# Patient Record
Sex: Male | Born: 1960 | Race: White | Hispanic: No | Marital: Married | Smoking: Never smoker
Health system: Southern US, Community
[De-identification: ages and names within clinical notes are randomized; demographics above are authoritative.]

## PROBLEM LIST (undated history)

## (undated) DIAGNOSIS — G610 Guillain-Barre syndrome: Secondary | ICD-10-CM

## (undated) DIAGNOSIS — I10 Essential (primary) hypertension: Secondary | ICD-10-CM

## (undated) HISTORY — PX: FRACTURE SURGERY: SHX138

---

## 2014-08-27 ENCOUNTER — Emergency Department (HOSPITAL_COMMUNITY): Payer: Non-veteran care

## 2014-08-27 ENCOUNTER — Emergency Department (HOSPITAL_COMMUNITY)
Admission: EM | Admit: 2014-08-27 | Discharge: 2014-08-27 | Disposition: A | Discharge status: Home / Self Care | Payer: Non-veteran care | Attending: Emergency Medicine | Admitting: Emergency Medicine

## 2014-08-27 ENCOUNTER — Encounter (HOSPITAL_COMMUNITY): Payer: Self-pay | Admitting: Emergency Medicine

## 2014-08-27 DIAGNOSIS — Y998 Other external cause status: Secondary | ICD-10-CM | POA: Diagnosis not present

## 2014-08-27 DIAGNOSIS — W11XXXA Fall on and from ladder, initial encounter: Secondary | ICD-10-CM | POA: Diagnosis not present

## 2014-08-27 DIAGNOSIS — Z23 Encounter for immunization: Secondary | ICD-10-CM | POA: Diagnosis not present

## 2014-08-27 DIAGNOSIS — S41111A Laceration without foreign body of right upper arm, initial encounter: Secondary | ICD-10-CM | POA: Insufficient documentation

## 2014-08-27 DIAGNOSIS — Y9289 Other specified places as the place of occurrence of the external cause: Secondary | ICD-10-CM | POA: Insufficient documentation

## 2014-08-27 DIAGNOSIS — Y9389 Activity, other specified: Secondary | ICD-10-CM | POA: Diagnosis not present

## 2014-08-27 DIAGNOSIS — W19XXXA Unspecified fall, initial encounter: Secondary | ICD-10-CM

## 2014-08-27 MED ORDER — OXYCODONE-ACETAMINOPHEN 5-325 MG PO TABS
1.0000 | ORAL_TABLET | Freq: Once | ORAL | Status: AC
  Administered 2014-08-27: 1 via ORAL
  Filled 2014-08-27: qty 1

## 2014-08-27 MED ORDER — OXYCODONE HCL 5 MG PO TABS: 5.0000 mg | ORAL_TABLET | ORAL | Status: AC | PRN

## 2014-08-27 MED ORDER — LIDOCAINE HCL 2 % IJ SOLN
20.0000 mL | Freq: Once | INTRAMUSCULAR | Status: DC
  Filled 2014-08-27: qty 20

## 2014-08-27 MED ORDER — TETANUS-DIPHTH-ACELL PERTUSSIS 5-2.5-18.5 LF-MCG/0.5 IM SUSP
0.5000 mL | Freq: Once | INTRAMUSCULAR | Status: DC
  Filled 2014-08-27: qty 0.5

## 2014-08-27 MED ORDER — LIDOCAINE-EPINEPHRINE (PF) 2 %-1:200000 IJ SOLN
10.0000 mL | Freq: Once | INTRAMUSCULAR | Status: AC
  Administered 2014-08-27: 10 mL
  Filled 2014-08-27: qty 20

## 2014-08-27 NOTE — ED Notes (Signed)
Pt to ED sts he fell through a floor while working on a old house.  Pt cut his right forearm on staples.  Dressing not removed in triage due to possible artery being cut

## 2014-08-27 NOTE — ED Notes (Signed)
MD at bedside. 

## 2014-08-27 NOTE — ED Provider Notes (Signed)
CSN: [id number]     Arrival date & time 08/27/14  1504 History   First MD Initiated Contact with Patient 08/27/14 1554     Chief Complaint  Patient presents with  . Extremity Laceration     (Consider location/radiation/quality/duration/timing/severity/associated sxs/prior Treatment) Patient is a 54 y.o. male presenting with fall. The history is provided by the patient. No language interpreter was used.  Fall This is a new problem. The current episode started today. The problem has been unchanged. Pertinent negatives include no abdominal pain, arthralgias, chest pain, congestion, coughing, diaphoresis, fatigue, numbness, vertigo or weakness. Nothing aggravates the symptoms. He has tried nothing for the symptoms.    History reviewed. No pertinent past medical history. Past Surgical History  Procedure Laterality Date  . Fracture surgery     No family history on file. History  Substance Use Topics  . Smoking status: Never Smoker   . Smokeless tobacco: Not on file  . Alcohol Use: No    Review of Systems  Constitutional: Negative for diaphoresis and fatigue.  HENT: Negative for congestion.   Respiratory: Negative for cough.   Cardiovascular: Negative for chest pain.  Gastrointestinal: Negative for abdominal pain.  Musculoskeletal: Negative for arthralgias.  Skin: Positive for wound.  Neurological: Negative for vertigo, weakness and numbness.      Allergies  Review of patient's allergies indicates no known allergies.  Home Medications   Prior to Admission medications   Not on File   BP 121/89 mmHg  Pulse 72  Temp(Src) 97.3 F (36.3 C) (Oral)  Resp 18  SpO2 99% Physical Exam  Constitutional: He is oriented to person, place, and time. He appears well-developed and well-nourished. No distress.  HENT:  Head: Normocephalic and atraumatic.  Nose: Nose normal.  Mouth/Throat: Oropharynx is clear and moist. No oropharyngeal exudate.  Eyes: EOM are normal. Pupils are equal,  round, and reactive to light.  Neck: Normal range of motion. Neck supple.  Cardiovascular: Normal rate, regular rhythm, normal heart sounds and intact distal pulses.   No murmur heard. Pulmonary/Chest: Effort normal and breath sounds normal. No respiratory distress. He has no wheezes. He exhibits no tenderness.  Abdominal: Soft. He exhibits no distension. There is no tenderness. There is no guarding.  Musculoskeletal: Normal range of motion. He exhibits tenderness (right elbow).       Arms: Tender to palp of proximal forearm.  Distally NVI with good sensation and strength, 2+ radial and ulnar pulses   Neurological: He is alert and oriented to person, place, and time. No cranial nerve deficit. Coordination normal.  Skin: Skin is warm and dry. He is not diaphoretic. No pallor.  Psychiatric: He has a normal mood and affect. His behavior is normal. Judgment and thought content normal.  Nursing note and vitals reviewed.   ED Course  LACERATION REPAIR Date/Time: 08/27/2014 7:49 PM Performed by: Lenell Antu Authorized by: Lenell Antu Consent: Verbal consent obtained. Risks and benefits: risks, benefits and alternatives were discussed Consent given by: patient Required items: required blood products, implants, devices, and special equipment available Time out: Immediately prior to procedure a "time out" was called to verify the correct patient, procedure, equipment, support staff and site/side marked as required. Body area: upper extremity Location details: right lower arm Wound length (cm): 8 cm x 2. Foreign bodies: no foreign bodies Tendon involvement: none Nerve involvement: none Vascular damage: no Anesthesia: local infiltration Local anesthetic: lidocaine 2% with epinephrine Anesthetic total: 8 ml Patient sedated: no Preparation: Patient was prepped and  draped in the usual sterile fashion. Irrigation solution: saline Irrigation method: syringe Amount of cleaning:  standard Debridement: none Degree of undermining: none Skin closure: 3-0 nylon Subcutaneous closure: 5-0 Chromic gut Number of sutures: 26 Technique: simple Approximation: close Approximation difficulty: simple Dressing: 4x4 sterile gauze Patient tolerance: Patient tolerated the procedure well with no immediate complications   (including critical care time) Labs Review Labs Reviewed - No data to display  Imaging Review Dg Elbow 2 Views Right  08/27/2014   CLINICAL DATA:  Laceration medial RIGHT elbow, fell off a ladder today and struck arm on counter  EXAM: RIGHT ELBOW - 2 VIEW  COMPARISON:  None  FINDINGS: Osseous mineralization normal.  Joint spaces preserved.  Tiny olecranon spur.  No acute fracture, dislocation or bone destruction.  Minimal spurring at radial head.  No elbow joint effusion.  Large soft tissue defect at medial aspect of proximal forearm consistent with history of laceration.  Overlying dressing artifacts.  IMPRESSION: No acute osseous abnormalities.   Electronically Signed   By: Ulyses SouthwardMark  Boles M.D.   On: 08/27/2014 16:22     EKG Interpretation None      MDM   Final diagnoses:  Arm laceration, right, initial encounter  Fall from standing, initial encounter   Pt is a 54 yo M with hx of Guillian barre who presented with right elbow lacerations after a fall from standing.  Stepped off the bottom rung of a ladder in a house being renovated and the floor fell through.  This caused him to fall backwards and impact his right arm on a dresser behind him. Suffered 2 x ~8 cm lacerations to his proximal right forearm.  Concern for a moderate amount of bleeding on the scene but this was controlled with pressure dressing.  On arrival, there was only mild oozing from the proximal laceration.  No arterial bleeding. Distally NVI.     Unknown last tetanus but unfortunately the vaccine is contraindicated in pts with guillian barre, so will hold it for now.  Pt informed of risks and  advised on the sx of tetanus to look out for.   All questions answered. Pain control given and xray of right elbow was ordered.   Xray negative for fracture.   His wounds were irrigated copiously.  Locally numbed with lidocaine with epinephrine.  Used several subcuticular sutures to better approximate the gaping wound edges, then they were closed with several simple interrupted sutures.  Used a total of 26 3-0 nylon external sutures between the two lacerations.  Patient tolerated it well.  Bacitracin was applied then the wounds were dressed.  Given instructions on wound care.  Advised to f/u with PCP in 7-10 days for wound check and suture removal.  Given instructions on ED return precautions and all questions were answered.  Will dc with Rx for roxicodone and advised tylenol/motrin for minor pain.  Advised no heavy lifting for 1 week or until the laceration closes.     Iimaging was reviewed and interpreted by myself and my attending, and incorporated in the medical decision making.  Patient was seen with ED Attending, Dr. Vicente MassonHarrison  Stevie Charter, MD    Lenell AntuJamie Kvon Mcilhenny, MD 08/29/14 1227  Purvis SheffieldForrest Harrison, MD 08/29/14 [phone]

## 2016-08-10 IMAGING — CR DG ELBOW 2V*R*
2 series · 2 of 2 positions shown · non-contrast
Comparison: None

CLINICAL DATA: Laceration medial RIGHT elbow, fell off a ladder
today and struck arm on counter

EXAM:
RIGHT ELBOW - 2 VIEW

[x elbow ap right]
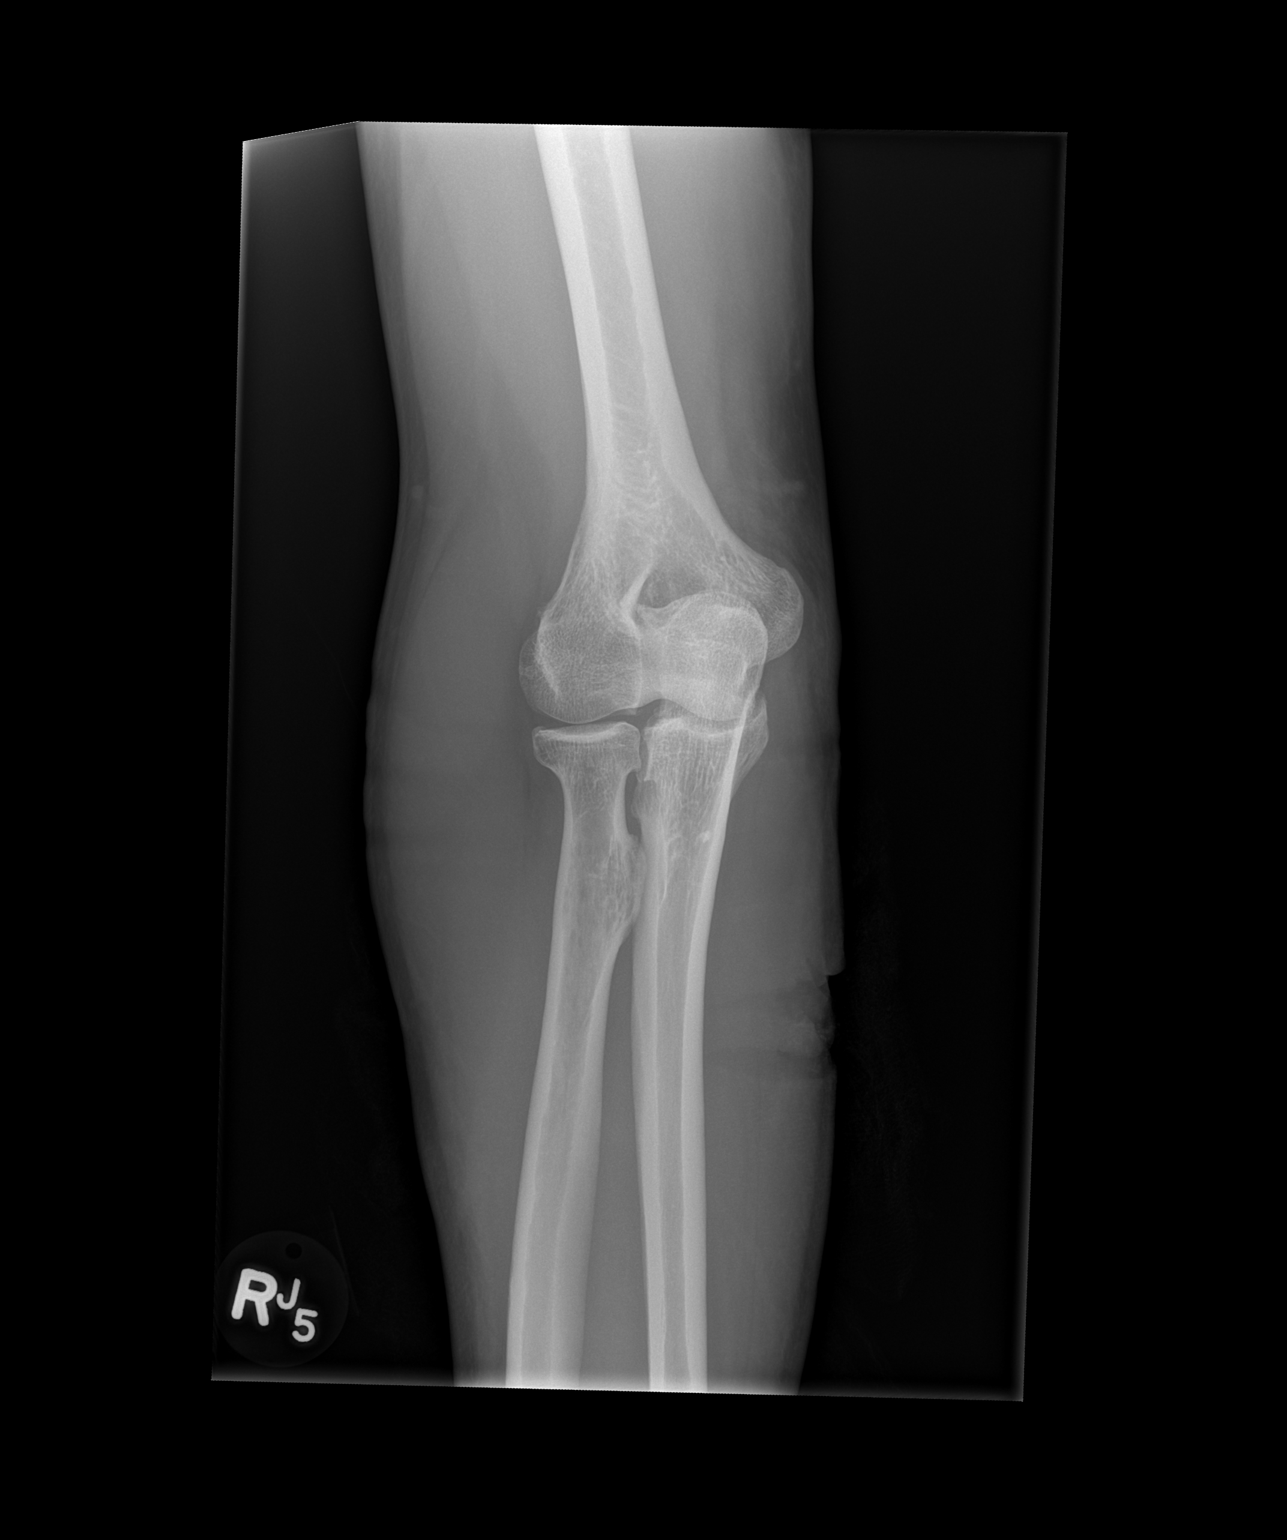

[x elbow lat right]
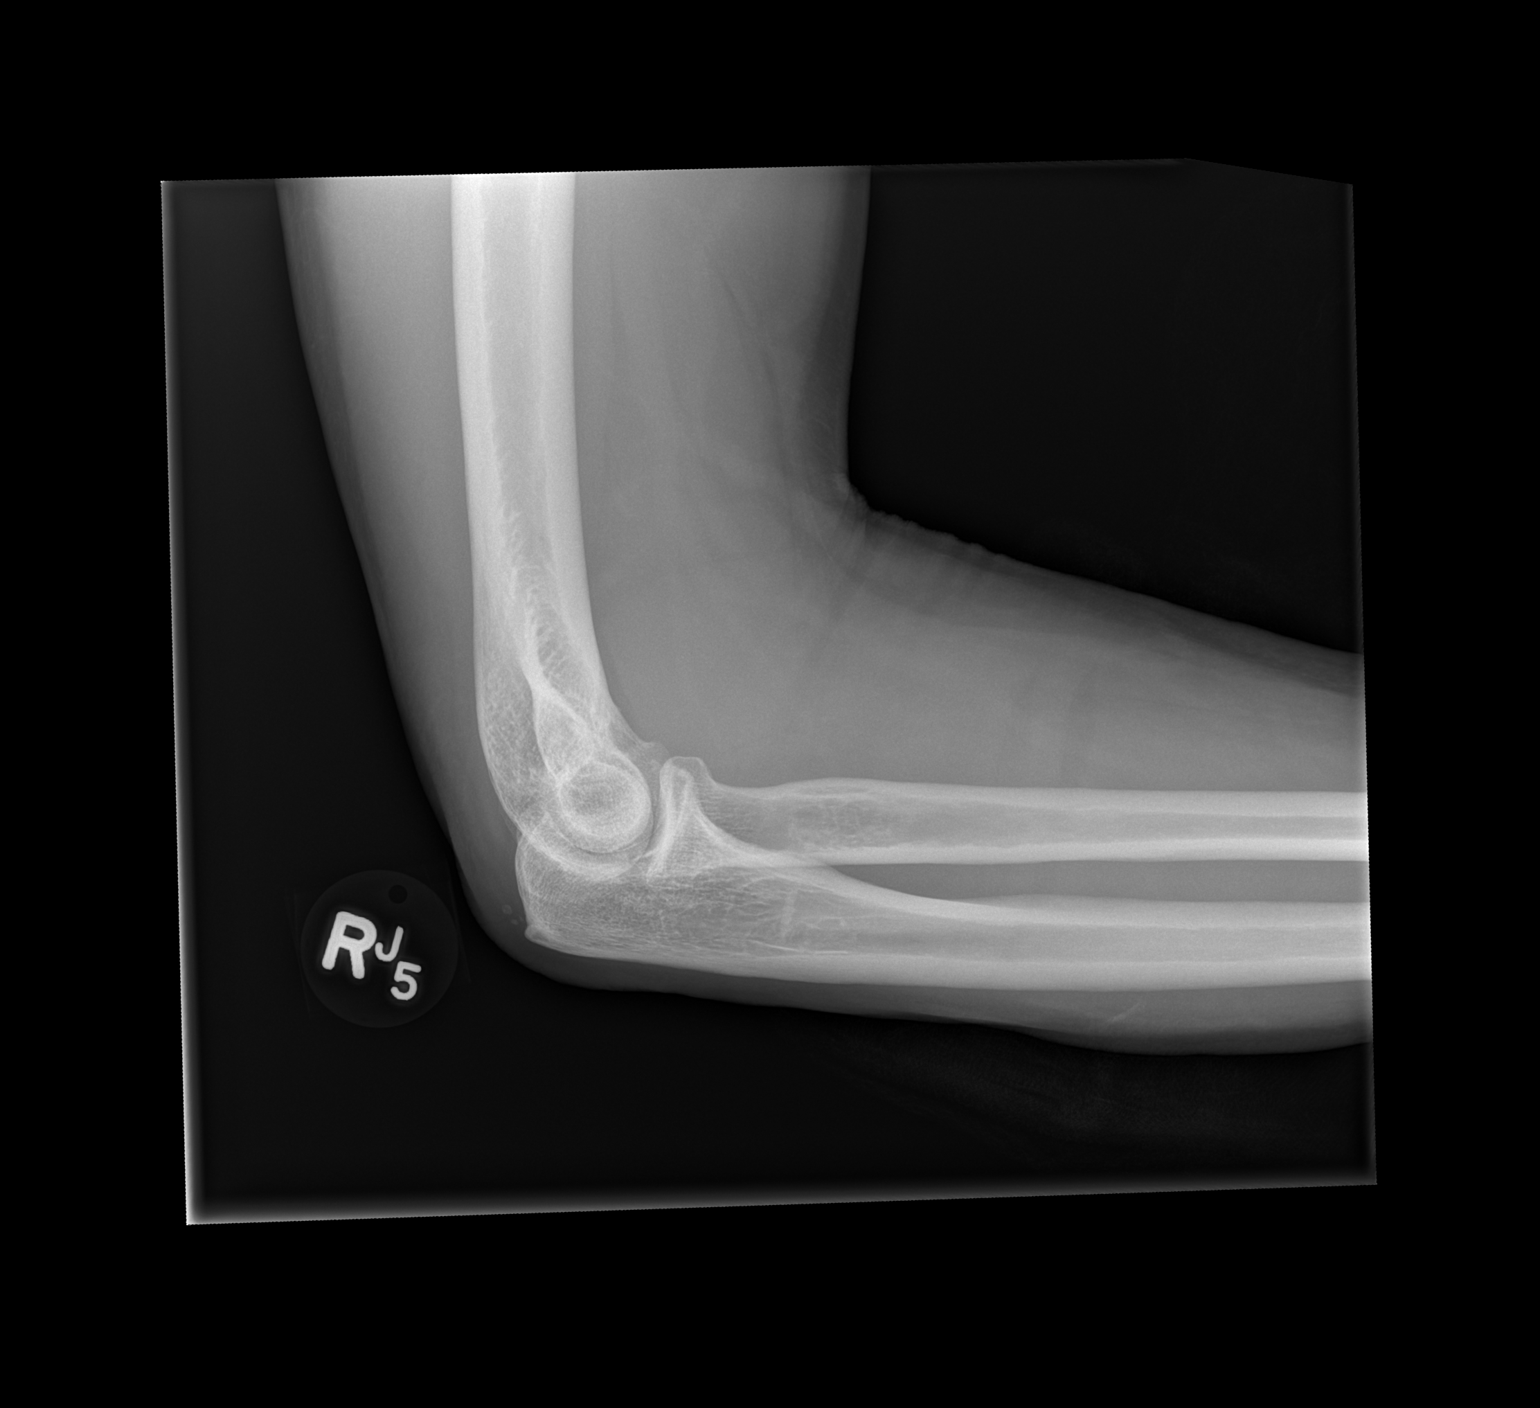

[2 of 2 positions shown; findings below may reference images not displayed]

FINDINGS: Osseous mineralization normal.

Joint spaces preserved.

Tiny olecranon spur.

No acute fracture, dislocation or bone destruction.

Minimal spurring at radial head.

No elbow joint effusion.

Large soft tissue defect at medial aspect of proximal forearm
consistent with history of laceration.

Overlying dressing artifacts.
IMPRESSION: No acute osseous abnormalities.

## 2017-03-29 ENCOUNTER — Other Ambulatory Visit: Payer: Self-pay

## 2017-03-29 ENCOUNTER — Ambulatory Visit: Payer: Non-veteran care | Attending: Family Medicine | Admitting: Physical Therapy

## 2017-03-29 DIAGNOSIS — R2681 Unsteadiness on feet: Secondary | ICD-10-CM | POA: Insufficient documentation

## 2017-03-29 DIAGNOSIS — G8929 Other chronic pain: Secondary | ICD-10-CM | POA: Diagnosis present

## 2017-03-29 DIAGNOSIS — M545 Low back pain: Secondary | ICD-10-CM | POA: Insufficient documentation

## 2017-03-29 NOTE — Therapy (Signed)
[facility] [address] [place], [place], [address] Phone: [phone]   Fax:  [phone]  Physical Therapy Evaluation  Patient Details  Name: Patrick DraftsKurt Dines MRN: [id number] Date of Birth: Apr 07, 1960 Referring Provider: Andee LinemanPiva, Enrico   Encounter Date: 03/29/2017  PT End of Session - 03/29/17 0916    Visit Number  1    Number of Visits  12    Date for PT Re-Evaluation  05/10/17    PT Start Time  0815    PT Stop Time  0901    PT Time Calculation (min)  46 min    Activity Tolerance  Patient tolerated treatment well    Behavior During Therapy  [facility] for tasks assessed/performed       No past medical history on file.  Past Surgical History:  Procedure Laterality Date  . FRACTURE SURGERY      There were no vitals filed for this visit.   Subjective Assessment - 03/29/17 1213    Subjective  Patient presents to physical therapy with complaints of chronic low back pain with occasional numbness and tingling down the buttocks. Patient states he has a history of low back pain secondary to injuries but patient reports after diagnosis of Guillan-Barre Syndrome 4 years ago, his back has "never been the same." Patient has regained motor function however LE sensation is diminished which he states may attribute to increase of falls. Patient states pain is 4-5/10 at rest. Patient states he has been using prescription medication to alleviate pain but realizes it only "masks the pain not fix it." Patient reports sitting and sleeping are difficult and reports moderate pain. Patient had an MRI results state intervertebral disc degeneration with radiculopathy. Patient also reported using a back brace for posture. Patient would like to decrease pain, improve movement, improve strength and return to recreational activities.     Pertinent History  Guillian-Barre Syndrome 2015    Limitations  Sitting    Diagnostic tests  MRI L1-L5 lumbar disc degeneration    Patient  Stated Goals  get rid of pain, move better, get back to working out.    Currently in Pain?  Yes    Pain Score  5     Pain Location  Back    Pain Orientation  Lower    Pain Type  Chronic pain    Pain Onset  More than a month ago    Pain Frequency  Constant         [facility] PT Assessment - 03/29/17 0001      Assessment   Medical Diagnosis  Low back pain    Referring Provider  Andee LinemanPiva, Enrico    Onset Date/Surgical Date  -- ongoing    Prior Therapy  yes      Balance Screen   Has the patient fallen in the past 6 months  Yes    How many times?  "a few"     Has the patient had a decrease in activity level because of a fear of falling?   Yes    Is the patient reluctant to leave their home because of a fear of falling?   No      Home Environment   Living Environment  Private residence    Type of Home  House    Home Access  Stairs to enter    Entrance Stairs-Number of Steps  20 to enter/ 16 to basement    Entrance Stairs-Rails  Left      Prior  Function   Level of Independence  Independent    Vocation  Full time employment      Sensation   Light Touch  Appears Intact;Impaired by gross assessment Left LE impaired      Posture/Postural Control   Posture/Postural Control  Postural limitations    Postural Limitations  Rounded Shoulders;Forward head;Flexed trunk      ROM / Strength   AROM / PROM / Strength  AROM;Strength      AROM   Overall AROM   Within functional limits for tasks performed      Strength   Strength Assessment Site  Hip;Knee    Right/Left Hip  Right;Left    Right Hip Flexion  4-/5    Right Hip ABduction  4+/5    Left Hip Flexion  4-/5    Left Hip ABduction  4/5    Right/Left Knee  Left;Right    Right Knee Flexion  4-/5    Right Knee Extension  4+/5    Left Knee Flexion  4-/5    Left Knee Extension  4/5      Flexibility   Soft Tissue Assessment /Muscle Length  yes    Hamstrings  40 degrees R/ 35 degrees L      Palpation   SI assessment   Equal leg  lengths, equal ASIS and PSIS, (-) SI compression test      Transfers   Transfers  Independent with all Transfers      Ambulation/Gait   Gait Pattern  Step-through pattern;Decreased trunk rotation;Decreased stride length      Balance   Balance Assessed  -- (+) Romberg, increased sway with feet together EO and EC             Objective measurements completed on examination: See above findings.      [facility] Adult PT Treatment/Exercise - 03/29/17 0001      Exercises   Exercises  Lumbar      Lumbar Exercises: Aerobic   Nustep  --             PT Education - 03/29/17 2058    Education provided  Yes    Education Details  HEP handout    Person(s) Educated  Patient    Methods  Handout;Demonstration;Explanation    Comprehension  Verbalized understanding       PT Short Term Goals - 03/29/17 2059      PT SHORT TERM GOAL #1   Title  Patient will be independent with HEP    Time  2    Period  Weeks    Status  New    Target Date  04/12/17        PT Long Term Goals - 03/29/17 2101      PT LONG TERM GOAL #1   Title  Patient will decrease pain to less than or equal to 2/10 with ADLs and functional activities.    Time  6    Period  Weeks    Status  New    Target Date  05/10/17      PT LONG TERM GOAL #2   Title  Patient will improve bilateral hip flexion strength to 5/5 to improve stability.    Time  6    Period  Weeks    Status  New    Target Date  05/10/17      PT LONG TERM GOAL #3   Title  Patient will improve bilateral hamstring flexibility to greater than 50 degrees.  Time  6    Period  Weeks    Status  New    Target Date  05/10/17      PT LONG TERM GOAL #4   Title  Patient will sit comfortably for greater than 30 minutes with less than 1/10 pain in order to drive.    Time  6    Period  Weeks    Status  New      PT LONG TERM GOAL #5   Title  Patient will self report sleeping throughout the night with less than 2/10 pain.    Time  6    Period   Weeks    Status  New    Target Date  05/10/17             Plan - 03/29/17 2046    Clinical Impression Statement  Patient is a 57 year old male who present to outpatient physical therapy with low back pain with occasional neurological symptoms down bilateral buttocks. Patient has decreased bilateral LE strength. Patient has diminished Left LE sensation. Patient has decreased balance as noted by (+) Romberg test. SLR test did not reproduce neurological sx; hamstring flexibility limited to 40 degrees right, 35 degrees left. Patient would benefit from skilled physical therapy to address low back pain, LE weakness, and balance deficits.    History and Personal Factors relevant to plan of care:  History of GBS, chronic low back pain,     Clinical Presentation  Stable    Clinical Decision Making  Low    Rehab Potential  Good    PT Frequency  2x / week    PT Duration  6 weeks    PT Treatment/Interventions  ADLs/Self Care Home Management;Electrical Stimulation;Cryotherapy;Iontophoresis 4mg /ml Dexamethasone;Moist Heat;Ultrasound;Gait training;Stair training;Therapeutic exercise;Patient/family education;Neuromuscular re-education;Balance training;Dry needling;Passive range of motion;Manual techniques;Therapeutic activities    PT Next Visit Plan  Begin with nustep, core strengthening and LE strengthening to address goals    Consulted and Agree with Plan of Care  Patient       Patient will benefit from skilled therapeutic intervention in order to improve the following deficits and impairments:  Pain, Decreased range of motion, Decreased strength, Decreased balance, Impaired flexibility  Visit Diagnosis: Chronic midline low back pain, with sciatica presence unspecified  Unsteadiness on feet     Problem List There are no active problems to display for this patient.   Patrick Guerrero 03/29/2017, 9:10 PM  [facility] [address] [place], [place], [address] Phone: [phone]   Fax:  [phone]  Name: Patrick Guerrero MRN: [id number] Date of Birth: 1960-03-29

## 2017-03-29 NOTE — Patient Instructions (Signed)
   Patrick Guerrero, PT, DPT [facility] [address] [place], [place], [address] Phone: [phone]   Fax:  [phone]  

## 2017-04-04 ENCOUNTER — Ambulatory Visit: Payer: Non-veteran care | Admitting: *Deleted

## 2017-04-04 ENCOUNTER — Encounter: Payer: Self-pay | Admitting: *Deleted

## 2017-04-04 DIAGNOSIS — G8929 Other chronic pain: Secondary | ICD-10-CM

## 2017-04-04 DIAGNOSIS — M545 Low back pain: Secondary | ICD-10-CM | POA: Diagnosis not present

## 2017-04-04 DIAGNOSIS — R2681 Unsteadiness on feet: Secondary | ICD-10-CM

## 2017-04-04 NOTE — Therapy (Signed)
[facility] [address] [place], [place], [address] Phone: [phone]   Fax:  [phone]  Physical Therapy Treatment  Patient Details  Name: Patrick DraftsKurt Kantner MRN: [id number] Date of Birth: 28-Oct-1960 Referring Provider: Andee LinemanPiva, Enrico   Encounter Date: 04/04/2017  PT End of Session - 04/04/17 0956    Visit Number  2    Number of Visits  12    Date for PT Re-Evaluation  05/10/17    PT Start Time  0815    PT Stop Time  0905    PT Time Calculation (min)  50 min       History reviewed. No pertinent past medical history.  Past Surgical History:  Procedure Laterality Date  . FRACTURE SURGERY      There were no vitals filed for this visit.  Subjective Assessment - 04/04/17 0829    Subjective   3-4/ 10 LBP today.          Patient presents to physical therapy with complaints of chronic low back pain with occasional numbness and tingling down the buttocks. Patient states he has a history of low back pain secondary to injuries but patient reports after diagnosis of Guillan-Barre Syndrome 4 years ago, his back has "never been the same." Patient has regained motor function however LE sensation is diminished which he states may attribute to increase of falls. Patient states pain is 4-5/10 at rest. Patient states he has been using prescription medication to alleviate pain but realizes it only "masks the pain not fix it." Patient reports sitting and sleeping are difficult and reports moderate pain. Patient had an MRI results state intervertebral disc degeneration with radiculopathy. Patient also reported using a back brace for posture. Patient would like to decrease pain, improve movement, improve strength and return to recreational activities.     Pertinent History  Guillian-Barre Syndrome 2015    Limitations  Sitting    Diagnostic tests  MRI L1-L5 lumbar disc degeneration    Patient Stated Goals  get rid of pain, move better, get back to working out.     Currently in Pain?  Yes    Pain Score  4     Pain Orientation  Lower    Pain Type  Chronic pain    Pain Onset  More than a month ago                      [facility] Adult [facility] Treatment/Exercise - 04/04/17 0001      Exercises   Exercises  Lumbar      Lumbar Exercises: Aerobic   Stationary Bike  x13 mins      Lumbar Exercises: Standing   Row  Strengthening xts pink 2x10    Shoulder Extension  Strengthening;20 reps XTS pink    Other Standing Lumbar Exercises  Red band givin for lat pulldown over door HEP. 2x10 today      Lumbar Exercises: Supine   Ab Set  20 reps;5 seconds    Bent Knee Raise  20 reps;3 seconds               PT Short Term Goals - 03/29/17 2059      PT SHORT TERM GOAL #1   Title  Patient will be independent with HEP    Time  2    Period  Weeks    Status  New    Target Date  04/12/17        PT Long Term Goals -  03/29/17 2101      PT LONG TERM GOAL #1   Title  Patient will decrease pain to less than or equal to 2/10 with ADLs and functional activities.    Time  6    Period  Weeks    Status  New    Target Date  05/10/17      PT LONG TERM GOAL #2   Title  Patient will improve bilateral hip flexion strength to 5/5 to improve stability.    Time  6    Period  Weeks    Status  New    Target Date  05/10/17      PT LONG TERM GOAL #3   Title  Patient will improve bilateral hamstring flexibility to greater than 50 degrees.    Time  6    Period  Weeks    Status  New    Target Date  05/10/17      PT LONG TERM GOAL #4   Title  Patient will sit comfortably for greater than 30 minutes with less than 1/10 pain in order to drive.    Time  6    Period  Weeks    Status  New      PT LONG TERM GOAL #5   Title  Patient will self report sleeping throughout the night with less than 2/10 pain.    Time  6    Period  Weeks    Status  New    Target Date  05/10/17            Plan - 04/04/17 0956    Clinical Impression Statement  Pt  arrived today wearing back brace for support and 5/10 LBP. Pt was instructed in standing and supine core exs for activation and awareness. He did well, but needed cues to decrease intensity. He had a few muscle cramps during standing and lying down exs that decreased with mild stretching. Pt advised to focus on core activation exs for HEP with low intensity and not to hold his breath.    Clinical Presentation  Stable    Clinical Decision Making  Low    Rehab Potential  Good    PT Frequency  2x / week    PT Duration  6 weeks    PT Treatment/Interventions  ADLs/Self Care Home Management;Electrical Stimulation;Cryotherapy;Iontophoresis 4mg /ml Dexamethasone;Moist Heat;Ultrasound;Gait training;Stair training;Therapeutic exercise;Patient/family education;Neuromuscular re-education;Balance training;Dry needling;Passive Guerrero of motion;Manual techniques;Therapeutic activities    PT Next Visit Plan  Begin with nustep, core strengthening and LE strengthening to address goals    Consulted and Agree with Plan of Care  Patient       Patient will benefit from skilled therapeutic intervention in order to improve the following deficits and impairments:  Pain, Decreased Guerrero of motion, Decreased strength, Decreased balance, Impaired flexibility  Visit Diagnosis: Chronic midline low back pain, with sciatica presence unspecified  Unsteadiness on feet     Problem List There are no active problems to display for this patient.   Tateanna Bach,CHRIS, PTA 04/04/2017, 10:09 AM  [facility] [address] [place], [place], [address] Phone: [phone]   Fax:  [phone]  Name: Patrick Guerrero MRN: [id number] Date of Birth: 07/08/1960

## 2017-04-06 ENCOUNTER — Encounter: Payer: Non-veteran care | Admitting: Physical Therapy

## 2017-04-12 ENCOUNTER — Ambulatory Visit: Payer: Non-veteran care | Admitting: *Deleted

## 2017-04-12 ENCOUNTER — Encounter: Payer: Self-pay | Admitting: *Deleted

## 2017-04-12 DIAGNOSIS — G8929 Other chronic pain: Secondary | ICD-10-CM

## 2017-04-12 DIAGNOSIS — R2681 Unsteadiness on feet: Secondary | ICD-10-CM

## 2017-04-12 DIAGNOSIS — M545 Low back pain: Secondary | ICD-10-CM | POA: Diagnosis not present

## 2017-04-12 NOTE — Therapy (Signed)
[facility] [address] [place], [place], [address] Phone: [phone]   Fax:  [phone]  Physical Therapy Treatment  Patient Details  Name: Patrick Guerrero MRN: [id number] Date of Birth: Jan 30, 1961 Referring Provider: Andee Lineman   Encounter Date: 04/12/2017  PT End of Session - 04/12/17 0858    Visit Number  3    Number of Visits  12    Date for PT Re-Evaluation  05/10/17    PT Start Time  0815    PT Stop Time  0905    PT Time Calculation (min)  50 min       No past medical history on file.  Past Surgical History:  Procedure Laterality Date  . FRACTURE SURGERY      There were no vitals filed for this visit.  Subjective Assessment - 04/12/17 0826    Subjective  Sore after last Rx , but ok today. 2 days without back brace    Pertinent History  Guillian-Barre Syndrome 2015    Limitations  Sitting    Diagnostic tests  MRI L1-L5 lumbar disc degeneration    Patient Stated Goals  get rid of pain, move better, get back to working out.    Currently in Pain?  Yes    Pain Score  2     Pain Location  Back    Pain Orientation  Lower    Pain Onset  More than a month ago    Pain Frequency  Constant                      [facility] Adult PT Treatment/Exercise - 04/12/17 0001      Exercises   Exercises  Lumbar      Lumbar Exercises: Aerobic   Nustep  Nustep x 15 mins  L7      Lumbar Exercises: Standing   Row  Strengthening xts pink 2x10    Shoulder Extension  Strengthening;20 reps XTS pink    Other Standing Lumbar Exercises  Lat pulldown XTS pink 2x10      Lumbar Exercises: Supine   Ab Set  20 reps;5 seconds    Bent Knee Raise  20 reps;3 seconds    Dead Bug  20 reps;3 seconds    Bridge  20 reps;3 seconds             PT Education - 04/12/17 0858    Education provided  Yes    Education Details  core exs    Person(s) Educated  Patient    Methods  Explanation;Demonstration;Verbal cues;Handout;Tactile cues     Comprehension  Verbalized understanding;Returned demonstration       PT Short Term Goals - 03/29/17 2059      PT SHORT TERM GOAL #1   Title  Patient will be independent with HEP    Time  2    Period  Weeks    Status  New    Target Date  04/12/17        PT Long Term Goals - 03/29/17 2101      PT LONG TERM GOAL #1   Title  Patient will decrease pain to less than or equal to 2/10 with ADLs and functional activities.    Time  6    Period  Weeks    Status  New    Target Date  05/10/17      PT LONG TERM GOAL #2   Title  Patient will improve bilateral hip flexion strength to  5/5 to improve stability.    Time  6    Period  Weeks    Status  New    Target Date  05/10/17      PT LONG TERM GOAL #3   Title  Patient will improve bilateral hamstring flexibility to greater than 50 degrees.    Time  6    Period  Weeks    Status  New    Target Date  05/10/17      PT LONG TERM GOAL #4   Title  Patient will sit comfortably for greater than 30 minutes with less than 1/10 pain in order to drive.    Time  6    Period  Weeks    Status  New      PT LONG TERM GOAL #5   Title  Patient will self report sleeping throughout the night with less than 2/10 pain.    Time  6    Period  Weeks    Status  New    Target Date  05/10/17            Plan - 04/12/17 0859    Clinical Impression Statement  Pt arrived today doing fairly well with no muscle spasms and was able to go without his brace for 2 days. He was able to complete all core therex standing and supine without increase pain. Handouts given for HEP.    Clinical Presentation  Stable    Rehab Potential  Good    PT Frequency  2x / week    PT Duration  6 weeks    PT Treatment/Interventions  ADLs/Self Care Home Management;Electrical Stimulation;Cryotherapy;Iontophoresis 4mg /ml Dexamethasone;Moist Heat;Ultrasound;Gait training;Stair training;Therapeutic exercise;Patient/family education;Neuromuscular re-education;Balance training;Dry  needling;Passive range of motion;Manual techniques;Therapeutic activities    PT Next Visit Plan  Begin with nustep, core strengthening and LE strengthening to address goals    Consulted and Agree with Plan of Care  Patient       Patient will benefit from skilled therapeutic intervention in order to improve the following deficits and impairments:  Pain, Decreased range of motion, Decreased strength, Decreased balance, Impaired flexibility  Visit Diagnosis: Chronic midline low back pain, with sciatica presence unspecified  Unsteadiness on feet     Problem List There are no active problems to display for this patient.   Sherisa Gilvin,CHRIS, PTA 04/12/2017, 9:18 AM  [facility] [address] [place], [place], [address] Phone: [phone]   Fax:  [phone]  Name: Patrick Guerrero MRN: [id number] Date of Birth: 1960/02/22

## 2017-04-12 NOTE — Patient Instructions (Signed)
Isometric Abdominal   Lying on back with knees bent, tighten stomach by pulling navel down. Hold ____ seconds. Repeat __5__ times per set. Do __5__ sets per session. Do _3-5___ sessions per day.  http://orth.exer.us/1086   Copyright  VHI. All rights reserved.  Bent Leg Lift (Hook-Lying)   Tighten stomach and slowly raise right leg _6___ inches from floor. Keep trunk rigid. Hold _2-3___ seconds. Repeat _10___ times per set. Do _3___ sets per session. Do _2-3___ sessions per day.  http://orth.exer.us/1090   Copyright  VHI. All rights reserved.  Bridging   Slowly raise buttocks from floor, keeping stomach tight. Repeat __10__ times per set. Do _3___ sets per session. Do __2-3__ sessions per day.  http://orth.exer.us/1096   Copyright  VHI. All rights reserved.  Bridging   Slowly raise buttocks from floor, keeping stomach tight. Repeat ____ times per set. Do ____ sets per session. Do ____ sessions per day.  http://orth.exer.us/1096   Copyright  VHI. All rights reserved.  Straight Leg Raise (Prone)   Abdomen and head supported, keep left knee locked and raise leg at hip. Avoid arching low back. Repeat _10___ times per set. Do _3___ sets per session. Do _2-3___ sessions per day.  http://orth.exer.us/1112   Copyright  VHI. All rights reserved.  Opposite Arm / Leg Lift (Prone)   Abdomen and head supported, left knee locked, raise leg and opposite arm ____ inches from floor. Repeat _10___ times per set. Do 3____ sets per session. Do __2-3__ sessions per day.  http://orth.exer.us/1114   Copyright  VHI. All rights reserved.  Combination (Hook-Lying)   Tighten stomach and slowly raise left leg and lower opposite arm over head. Keep trunk rigid. Repeat _10___ times per set. Do _3___ sets per session. Do _2-3___ sessions per day.  http://orth.exer.us/1092   Copyright  VHI. All rights reserved.  Upper / Lower Extremity Extension (All-Fours)   Tighten stomach  and raise right leg and opposite arm. Keep trunk rigid. Repeat __10__ times per set. Do __3__ sets per session. Do _2-3___ sessions per day.  http://orth.exer.us/1118   Copyright  VHI. All rights reserved.   

## 2017-04-13 ENCOUNTER — Encounter: Payer: Non-veteran care | Admitting: *Deleted

## 2017-04-17 ENCOUNTER — Encounter: Payer: Self-pay | Admitting: *Deleted

## 2017-04-17 ENCOUNTER — Ambulatory Visit: Payer: Non-veteran care | Admitting: *Deleted

## 2017-04-17 DIAGNOSIS — R2681 Unsteadiness on feet: Secondary | ICD-10-CM

## 2017-04-17 DIAGNOSIS — M545 Low back pain: Secondary | ICD-10-CM | POA: Diagnosis not present

## 2017-04-17 DIAGNOSIS — G8929 Other chronic pain: Secondary | ICD-10-CM

## 2017-04-17 NOTE — Therapy (Signed)
[facility] [address] [place], [place], [address] Phone: [phone]   Fax:  [phone]  Physical Therapy Treatment  Patient Details  Name: Patrick Guerrero MRN: [id number] Date of Birth: 04-12-1960 Referring Provider: Andee LinemanPiva, Patrick   Encounter Date: 04/17/2017  PT End of Session - 04/17/17 0830    Visit Number  4    Number of Visits  12    Date for PT Re-Evaluation  05/10/17    PT Start Time  0815    PT Stop Time  0905    PT Time Calculation (min)  50 min       History reviewed. No pertinent past medical history.  Past Surgical History:  Procedure Laterality Date  . FRACTURE SURGERY      There were no vitals filed for this visit.  Subjective Assessment - 04/17/17 0824    Subjective  Did better after last Rx, but yesterday was bad due to a lot of sitting.    Pertinent History  Guillian-Barre Syndrome 2015    Limitations  Sitting    Diagnostic tests  MRI L1-L5 lumbar disc degeneration    Patient Stated Goals  get rid of pain, move better, get back to working out.    Currently in Pain?  Yes    Pain Score  3     Pain Orientation  Lower    Pain Descriptors / Indicators  Burning    Pain Type  Chronic pain    Pain Onset  More than a month ago    Pain Frequency  Constant                      [facility] Adult PT Treatment/Exercise - 04/17/17 0001      Exercises   Exercises  Lumbar      Lumbar Exercises: Aerobic   Nustep  Nustep x 20 mins  L6      Lumbar Exercises: Standing   Forward Lunge  20 reps 4# ball reachouts    Row  Strengthening xts pink 2x10    Shoulder Extension  Strengthening;20 reps XTS pink    Other Standing Lumbar Exercises  Lat pulldown XTS pink 2x10, Punches x 40      Lumbar Exercises: Supine   Bent Knee Raise  20 reps;3 seconds    Dead Bug  20 reps;3 seconds    Bridge  20 reps;3 seconds               PT Short Term Goals - 03/29/17 2059      PT SHORT TERM GOAL #1   Title   Patient will be independent with HEP    Time  2    Period  Weeks    Status  New    Target Date  04/12/17        PT Long Term Goals - 03/29/17 2101      PT LONG TERM GOAL #1   Title  Patient will decrease pain to less than or equal to 2/10 with ADLs and functional activities.    Time  6    Period  Weeks    Status  New    Target Date  05/10/17      PT LONG TERM GOAL #2   Title  Patient will improve bilateral hip flexion strength to 5/5 to improve stability.    Time  6    Period  Weeks    Status  New    Target Date  05/10/17  PT LONG TERM GOAL #3   Title  Patient will improve bilateral hamstring flexibility to greater than 50 degrees.    Time  6    Period  Weeks    Status  New    Target Date  05/10/17      PT LONG TERM GOAL #4   Title  Patient will sit comfortably for greater than 30 minutes with less than 1/10 pain in order to drive.    Time  6    Period  Weeks    Status  New      PT LONG TERM GOAL #5   Title  Patient will self report sleeping throughout the night with less than 2/10 pain.    Time  6    Period  Weeks    Status  New    Target Date  05/10/17            Plan - 04/17/17 0920    Clinical Impression Statement  Pt arrived today doing ok from last Rx, but not feeling well due to stomach issues. He was able to complete all standing core exs and was given prone leg and arm raise for HEP.Marland Kitchen Pt was able to go without brace this morning, but needed while driving.    Clinical Presentation  Stable    Clinical Decision Making  Low       Patient will benefit from skilled therapeutic intervention in order to improve the following deficits and impairments:  Pain, Decreased range of motion, Decreased strength, Decreased balance, Impaired flexibility  Visit Diagnosis: Chronic midline low back pain, with sciatica presence unspecified  Unsteadiness on feet     Problem List There are no active problems to display for this patient.   Patrick Guerrero,  PTA 04/17/2017, 9:43 AM  [facility] [address] [place], [place], [address] Phone: [phone]   Fax:  [phone]  Name: Patrick Guerrero MRN: [id number] Date of Birth: 1960/09/14

## 2017-04-19 ENCOUNTER — Ambulatory Visit: Payer: Non-veteran care | Admitting: *Deleted

## 2017-04-19 DIAGNOSIS — M545 Low back pain: Principal | ICD-10-CM

## 2017-04-19 DIAGNOSIS — R2681 Unsteadiness on feet: Secondary | ICD-10-CM

## 2017-04-19 DIAGNOSIS — G8929 Other chronic pain: Secondary | ICD-10-CM

## 2017-04-19 NOTE — Therapy (Signed)
[facility] [address] [place], [place], [address] Phone: [phone]   Fax:  [phone]  Physical Therapy Treatment  Patient Details  Name: Patrick Guerrero MRN: [id number] Date of Birth: March 14, 1960 Referring Provider: Andee Lineman   Encounter Date: 04/19/2017  PT End of Session - 04/19/17 0834    Visit Number  5    Number of Visits  12    Date for PT Re-Evaluation  05/10/17    PT Start Time  0833 Pt 18 mins late    PT Stop Time  0908    PT Time Calculation (min)  35 min       No past medical history on file.  Past Surgical History:  Procedure Laterality Date  . FRACTURE SURGERY      There were no vitals filed for this visit.  Subjective Assessment - 04/19/17 0835    Subjective  Pt 18 mins late. Over slept. Lathargic yesterday and today    Pertinent History  Guillian-Barre Syndrome 2015    Limitations  Sitting    Diagnostic tests  MRI L1-L5 lumbar disc degeneration    Patient Stated Goals  get rid of pain, move better, get back to working out.    Currently in Pain?  Yes    Pain Score  3     Pain Location  Back    Pain Orientation  Lower    Pain Descriptors / Indicators  Burning    Pain Type  Chronic pain    Pain Onset  More than a month ago                      [facility] Adult PT Treatment/Exercise - 04/19/17 0001      Exercises   Exercises  Lumbar      Lumbar Exercises: Aerobic   Nustep  Nustep x 20 mins  L6      Lumbar Exercises: Standing   Row  Strengthening xts pink 2x10    Shoulder Extension  Strengthening;20 reps XTS pink    Other Standing Lumbar Exercises  Lat pulldown XTS pink 2x10, Punches x 40/; woodchops 2x10 each      Lumbar Exercises: Prone   Opposite Arm/Leg Raise  Right arm/Left leg;Left arm/Right leg;20 reps               PT Short Term Goals - 03/29/17 2059      PT SHORT TERM GOAL #1   Title  Patient will be independent with HEP    Time  2    Period  Weeks    Status   New    Target Date  04/12/17        PT Long Term Goals - 03/29/17 2101      PT LONG TERM GOAL #1   Title  Patient will decrease pain to less than or equal to 2/10 with ADLs and functional activities.    Time  6    Period  Weeks    Status  New    Target Date  05/10/17      PT LONG TERM GOAL #2   Title  Patient will improve bilateral hip flexion strength to 5/5 to improve stability.    Time  6    Period  Weeks    Status  New    Target Date  05/10/17      PT LONG TERM GOAL #3   Title  Patient will improve bilateral hamstring flexibility to greater than 50  degrees.    Time  6    Period  Weeks    Status  New    Target Date  05/10/17      PT LONG TERM GOAL #4   Title  Patient will sit comfortably for greater than 30 minutes with less than 1/10 pain in order to drive.    Time  6    Period  Weeks    Status  New      PT LONG TERM GOAL #5   Title  Patient will self report sleeping throughout the night with less than 2/10 pain.    Time  6    Period  Weeks    Status  New    Target Date  05/10/17            Plan - 04/19/17 [id number]    Clinical Impression Statement  Pt arrived today 18 mins late due to over sleeping. He was also feeling lathargic today. Pt was able to perform standing and prone core and functional exs with minimal muscle spasms today.    Clinical Presentation  Stable    Clinical Decision Making  Low    Rehab Potential  Good    PT Frequency  2x / week    PT Duration  6 weeks    PT Treatment/Interventions  ADLs/Self Care Home Management;Electrical Stimulation;Cryotherapy;Iontophoresis 4mg /ml Dexamethasone;Moist Heat;Ultrasound;Gait training;Stair training;Therapeutic exercise;Patient/family education;Neuromuscular re-education;Balance training;Dry needling;Passive range of motion;Manual techniques;Therapeutic activities    PT Next Visit Plan  Begin with nustep, core strengthening and LE strengthening to address goals    Consulted and Agree with Plan of Care   Patient       Patient will benefit from skilled therapeutic intervention in order to improve the following deficits and impairments:  Pain, Decreased range of motion, Decreased strength, Decreased balance, Impaired flexibility  Visit Diagnosis: Chronic midline low back pain, with sciatica presence unspecified  Unsteadiness on feet     Problem List There are no active problems to display for this patient.   Paz Fuentes,CHRIS, PTA 04/19/2017, 10:14 AM  [facility] [address] [place], [place], [address] Phone: [phone]   Fax:  [phone]  Name: Patrick Guerrero MRN: [id number] Date of Birth: [birth year]

## 2017-04-26 ENCOUNTER — Ambulatory Visit: Payer: Non-veteran care | Attending: Family Medicine | Admitting: *Deleted

## 2017-04-26 ENCOUNTER — Encounter: Payer: Self-pay | Admitting: *Deleted

## 2017-04-26 DIAGNOSIS — M545 Low back pain: Secondary | ICD-10-CM | POA: Insufficient documentation

## 2017-04-26 DIAGNOSIS — R2681 Unsteadiness on feet: Secondary | ICD-10-CM | POA: Insufficient documentation

## 2017-04-26 DIAGNOSIS — G8929 Other chronic pain: Secondary | ICD-10-CM | POA: Insufficient documentation

## 2017-04-26 NOTE — Therapy (Signed)
[facility] [address] [place], [place], [address] Phone: [phone]   Fax:  [phone]  Physical Therapy Treatment  Patient Details  Name: Patrick DraftsKurt Guerrero MRN: [id number] Date of Birth: 07/31/1960 Referring Provider: Andee LinemanPiva, Enrico   Encounter Date: 04/26/2017  PT End of Session - 04/26/17 0832    Visit Number  6    Number of Visits  12    PT Start Time  0815    PT Stop Time  0904    PT Time Calculation (min)  49 min       History reviewed. No pertinent past medical history.  Past Surgical History:  Procedure Laterality Date  . FRACTURE SURGERY      There were no vitals filed for this visit.  Subjective Assessment - 04/26/17 0825    Subjective  Pt reports feeling better with core contraction and stability, but pain and symptoms in his feet hasn't changed    Pertinent History  Guillian-Barre Syndrome 2015    Limitations  Sitting    Diagnostic tests  MRI L1-L5 lumbar disc degeneration    Patient Stated Goals  get rid of pain, move better, get back to working out.    Currently in Pain?  Yes    Pain Score  6     Pain Location  Back    Pain Orientation  Lower    Pain Descriptors / Indicators  Burning    Pain Onset  More than a month ago                      [facility] - 04/26/17 0001      Exercises   Exercises  Lumbar      Lumbar Exercises: Aerobic   Nustep  Nustep x 20 mins  L6      Lumbar Exercises: Standing   Row  Strengthening xts pink 4x10    Shoulder Extension  Strengthening;20 reps XTS pink  2x20    Other Standing Lumbar Exercises  Lat pulldown XTS pink 4x10, Punches x 40/; woodchops 2x10 each      Lumbar Exercises: Prone   Opposite Arm/Leg Raise  Right arm/Left leg;Left arm/Right leg;20 reps 2x20               PT Short Term Goals - 03/29/17 2059      PT SHORT TERM GOAL #1   Title  Patient will be independent with HEP    Time  2    Period  Weeks    Status   New    Target Date  04/12/17        PT Long Term Goals - 03/29/17 2101      PT LONG TERM GOAL #1   Title  Patient will decrease pain to less than or equal to 2/10 with ADLs and functional activities.    Time  6    Period  Weeks    Status  New    Target Date  05/10/17      PT LONG TERM GOAL #2   Title  Patient will improve bilateral hip flexion strength to 5/5 to improve stability.    Time  6    Period  Weeks    Status  New    Target Date  05/10/17      PT LONG TERM GOAL #3   Title  Patient will improve bilateral hamstring flexibility to greater than 50 degrees.    Time  6  Period  Weeks    Status  New    Target Date  05/10/17      PT LONG TERM GOAL #4   Title  Patient will sit comfortably for greater than 30 minutes with less than 1/10 pain in order to drive.    Time  6    Period  Weeks    Status  New      PT LONG TERM GOAL #5   Title  Patient will self report sleeping throughout the night with less than 2/10 pain.    Time  6    Period  Weeks    Status  New    Target Date  05/10/17            Plan - 04/26/17 0916    Clinical Impression Statement  Pt arrived today doing fair. He feels that PT is really helping with core and overall strength and function, but is still concerned with the burning  pain in his LEs. HJe was able to complete all core exs today  and has progressed with level of difficulty. No change in Pt's pain level.    Clinical Presentation  Stable    Clinical Decision Making  Low    Rehab Potential  Good    PT Frequency  2x / week    PT Treatment/Interventions  ADLs/Self Care Home Management;Electrical Stimulation;Cryotherapy;Iontophoresis 4mg /ml Dexamethasone;Moist Heat;Ultrasound;Gait training;Stair training;Therapeutic exercise;Patient/family education;Neuromuscular re-education;Balance training;Dry needling;Passive range of motion;Manual techniques;Therapeutic activities    PT Next Visit Plan  Begin with nustep, core strengthening and LE  strengthening to address goals       Patient will benefit from skilled therapeutic intervention in order to improve the following deficits and impairments:  Pain, Decreased range of motion, Decreased strength, Decreased balance, Impaired flexibility  Visit Diagnosis: Chronic midline low back pain, with sciatica presence unspecified  Unsteadiness on feet     Problem List There are no active problems to display for this patient.   RAMSEUR,CHRIS, PTA 04/26/2017, 10:56 AM  [facility] [address] [place], [place], [address] Phone: [phone]   Fax:  [phone]  Name: Patrick Guerrero MRN: [id number] Date of Birth: 06/12/1960

## 2017-04-30 ENCOUNTER — Ambulatory Visit: Payer: Non-veteran care | Admitting: *Deleted

## 2017-04-30 DIAGNOSIS — R2681 Unsteadiness on feet: Secondary | ICD-10-CM

## 2017-04-30 DIAGNOSIS — M545 Low back pain: Secondary | ICD-10-CM | POA: Diagnosis not present

## 2017-04-30 NOTE — Therapy (Signed)
[facility] [address] [place], [place], [address] Phone: [phone]   Fax:  [phone]  Physical Therapy Treatment  Patient Details  Name: Patrick Guerrero MRN: [id number] Date of Birth: 02-14-1961 Referring Provider: Andee Lineman   Encounter Date: 04/30/2017  PT End of Session - 04/30/17 0856    Visit Number  7    Number of Visits  12    Date for PT Re-Evaluation  05/10/17    PT Start Time  0823    PT Stop Time  0907    PT Time Calculation (min)  44 min       No past medical history on file.  Past Surgical History:  Procedure Laterality Date  . FRACTURE SURGERY      There were no vitals filed for this visit.  Subjective Assessment - 04/30/17 0837    Subjective  Did ok after last Rx. Not to sore, but my pain is the same.    Pertinent History  Guillian-Barre Syndrome 2015    Limitations  Sitting    Diagnostic tests  MRI L1-L5 lumbar disc degeneration    Patient Stated Goals  get rid of pain, move better, get back to working out.    Currently in Pain?  Yes    Pain Score  6     Pain Location  Back    Pain Orientation  Lower    Pain Type  Chronic pain    Pain Onset  More than a month ago    Pain Frequency  Constant                      [facility] Adult PT Treatment/Exercise - 04/30/17 0001      Exercises   Exercises  Lumbar      Lumbar Exercises: Aerobic   Nustep  Nustep x 20 mins  L6      Lumbar Exercises: Standing   Row  Strengthening xts pink 4x10    Shoulder Extension  Strengthening;20 reps XTS pink  2x20    Other Standing Lumbar Exercises  Lat pulldown XTS pink 4x10, Punches x 40/; woodchops 2x10 each      Lumbar Exercises: Prone   Opposite Arm/Leg Raise  Right arm/Left leg;Left arm/Right leg;20 reps 2x20      Lumbar Exercises: Quadruped   Straight Leg Raise  20 reps    Opposite Arm/Leg Raise  20 reps;5 seconds               PT Short Term Goals - 03/29/17 2059      PT SHORT TERM GOAL  #1   Title  Patient will be independent with HEP    Time  2    Period  Weeks    Status  New    Target Date  04/12/17        PT Long Term Goals - 03/29/17 2101      PT LONG TERM GOAL #1   Title  Patient will decrease pain to less than or equal to 2/10 with ADLs and functional activities.    Time  6    Period  Weeks    Status  New    Target Date  05/10/17      PT LONG TERM GOAL #2   Title  Patient will improve bilateral hip flexion strength to 5/5 to improve stability.    Time  6    Period  Weeks    Status  New    Target  Date  05/10/17      PT LONG TERM GOAL #3   Title  Patient will improve bilateral hamstring flexibility to greater than 50 degrees.    Time  6    Period  Weeks    Status  New    Target Date  05/10/17      PT LONG TERM GOAL #4   Title  Patient will sit comfortably for greater than 30 minutes with less than 1/10 pain in order to drive.    Time  6    Period  Weeks    Status  New      PT LONG TERM GOAL #5   Title  Patient will self report sleeping throughout the night with less than 2/10 pain.    Time  6    Period  Weeks    Status  New    Target Date  05/10/17            Plan - 04/30/17 0857    Clinical Impression Statement  Pt arrived today doing fairly well, but continues to have foot and LE pain. He did well with core and stabiliy Therex and was able to progress with quadruped arm/leg raise fo multifidus challenge.    Clinical Presentation  Stable    Clinical Decision Making  Low    Rehab Potential  Good    PT Frequency  2x / week    PT Duration  6 weeks    PT Treatment/Interventions  ADLs/Self Care Home Management;Electrical Stimulation;Cryotherapy;Iontophoresis 4mg /ml Dexamethasone;Moist Heat;Ultrasound;Gait training;Stair training;Therapeutic exercise;Patient/family education;Neuromuscular re-education;Balance training;Dry needling;Passive range of motion;Manual techniques;Therapeutic activities    PT Next Visit Plan  Begin with nustep,  core strengthening and LE strengthening to address goals    Consulted and Agree with Plan of Care  Patient       Patient will benefit from skilled therapeutic intervention in order to improve the following deficits and impairments:  Pain, Decreased range of motion, Decreased strength, Decreased balance, Impaired flexibility  Visit Diagnosis: Unsteadiness on feet     Problem List There are no active problems to display for this patient.   ,CHRIS, PTA 04/30/2017, 9:22 AM  [facility] [address] [place], [place], [address] Phone: [phone]   Fax:  [phone]  Name: Patrick DraftsKurt Guerrero MRN: [id number] Date of Birth: Oct 17, 1960

## 2017-05-01 ENCOUNTER — Ambulatory Visit: Payer: Non-veteran care | Admitting: *Deleted

## 2017-05-01 DIAGNOSIS — M545 Low back pain: Secondary | ICD-10-CM

## 2017-05-01 DIAGNOSIS — G8929 Other chronic pain: Secondary | ICD-10-CM

## 2017-05-01 DIAGNOSIS — R2681 Unsteadiness on feet: Secondary | ICD-10-CM

## 2017-05-01 NOTE — Therapy (Signed)
[facility] [address] [place], [place], [address] Phone: [phone]   Fax:  [phone]  Physical Therapy Treatment  Patient Details  Name: Patrick Guerrero MRN: [id number] Date of Birth: 28-Apr-1960 Referring Provider: Andee Lineman   Encounter Date: 05/01/2017  PT End of Session - 05/01/17 1700    Visit Number  8    Number of Visits  12    Date for PT Re-Evaluation  05/10/17    PT Start Time  1646    PT Stop Time  1735    PT Time Calculation (min)  49 min       No past medical history on file.  Past Surgical History:  Procedure Laterality Date  . FRACTURE SURGERY      There were no vitals filed for this visit.  Subjective Assessment - 05/01/17 1708    Subjective  Today my pain level is good 2/10    Pertinent History  Guillian-Barre Syndrome 2015    Limitations  Sitting    Diagnostic tests  MRI L1-L5 lumbar disc degeneration    Patient Stated Goals  get rid of pain, move better, get back to working out.    Currently in Pain?  Yes    Pain Score  2     Pain Location  Back    Pain Orientation  Lower    Pain Descriptors / Indicators  Burning    Pain Onset  More than a month ago    Pain Frequency  Constant                      [facility] Adult PT Treatment/Exercise - 05/01/17 0001      Exercises   Exercises  Lumbar      Lumbar Exercises: Aerobic   Nustep  Nustep x 20 mins  L6      Lumbar Exercises: Standing   Row  Strengthening xts pink 4x10    Shoulder Extension  Strengthening;20 reps XTS pink  2x20    Other Standing Lumbar Exercises  Lat pulldown XTS pink 4x10, Punches x 40/; woodchops 2x10 each      Lumbar Exercises: Prone   Opposite Arm/Leg Raise  Right arm/Left leg;Left arm/Right leg;20 reps 2x20      Lumbar Exercises: Quadruped   Straight Leg Raise  20 reps    Opposite Arm/Leg Raise  20 reps;5 seconds               PT Short Term Goals - 03/29/17 2059      PT SHORT TERM GOAL #1   Title   Patient will be independent with HEP    Time  2    Period  Weeks    Status  New    Target Date  04/12/17        PT Long Term Goals - 03/29/17 2101      PT LONG TERM GOAL #1   Title  Patient will decrease pain to less than or equal to 2/10 with ADLs and functional activities.    Time  6    Period  Weeks    Status  New    Target Date  05/10/17      PT LONG TERM GOAL #2   Title  Patient will improve bilateral hip flexion strength to 5/5 to improve stability.    Time  6    Period  Weeks    Status  New    Target Date  05/10/17  PT LONG TERM GOAL #3   Title  Patient will improve bilateral hamstring flexibility to greater than 50 degrees.    Time  6    Period  Weeks    Status  New    Target Date  05/10/17      PT LONG TERM GOAL #4   Title  Patient will sit comfortably for greater than 30 minutes with less than 1/10 pain in order to drive.    Time  6    Period  Weeks    Status  New      PT LONG TERM GOAL #5   Title  Patient will self report sleeping throughout the night with less than 2/10 pain.    Time  6    Period  Weeks    Status  New    Target Date  05/10/17            Plan - 05/01/17 1719    PT Treatment/Interventions  ADLs/Self Care Home Management;Electrical Stimulation;Cryotherapy;Iontophoresis 4mg /ml Dexamethasone;Moist Heat;Ultrasound;Gait training;Stair training;Therapeutic exercise;Patient/family education;Neuromuscular re-education;Balance training;Dry needling;Passive range of motion;Manual techniques;Therapeutic activities    PT Next Visit Plan  Begin with nustep, core strengthening and LE strengthening to address goals       Patient will benefit from skilled therapeutic intervention in order to improve the following deficits and impairments:     Visit Diagnosis: Unsteadiness on feet  Chronic midline low back pain, with sciatica presence unspecified     Problem List There are no active problems to display for this  patient.   Soma Lizak,CHRIS, PTA 05/01/2017, 5:42 PM  [facility] [address] [place], [place], [address] Phone: [phone]   Fax:  [phone]  Name: Patrick Guerrero MRN: [id number] Date of Birth: 1960-06-11

## 2017-05-02 ENCOUNTER — Ambulatory Visit: Payer: Non-veteran care | Admitting: *Deleted

## 2017-05-02 ENCOUNTER — Encounter: Payer: Self-pay | Admitting: *Deleted

## 2017-05-02 DIAGNOSIS — G8929 Other chronic pain: Secondary | ICD-10-CM

## 2017-05-02 DIAGNOSIS — R2681 Unsteadiness on feet: Secondary | ICD-10-CM

## 2017-05-02 DIAGNOSIS — M545 Low back pain: Secondary | ICD-10-CM

## 2017-05-02 NOTE — Therapy (Signed)
[facility] [address] [place], [place], [address] Phone: [phone]   Fax:  [phone]  Physical Therapy Treatment  Patient Details  Name: Patrick DraftsKurt Guerrero MRN: [id number] Date of Birth: [birth year] Referring Provider: Andee LinemanPiva, Enrico   Encounter Date: 05/02/2017  PT End of Session - 05/02/17 0858    Visit Number  9    Number of Visits  12    Date for PT Re-Evaluation  05/10/17    PT Start Time  0816    PT Stop Time  0905    PT Time Calculation (min)  49 min       History reviewed. No pertinent past medical history.  Past Surgical History:  Procedure Laterality Date  . FRACTURE SURGERY      There were no vitals filed for this visit.  Subjective Assessment - 05/02/17 1019    Subjective  Still doing fair this morning and pain is still2/10    Pertinent History  Guillian-Barre Syndrome 2015    Limitations  Sitting    Diagnostic tests  MRI L1-L5 lumbar disc degeneration    Patient Stated Goals  get rid of pain, move better, get back to working out.    Currently in Pain?  Yes    Pain Score  2     Pain Location  Back    Pain Orientation  Lower    Pain Descriptors / Indicators  Burning    Pain Type  Chronic pain    Pain Onset  More than a month ago    Pain Frequency  Constant                      [facility] Adult PT Treatment/Exercise - 05/02/17 0001      Exercises   Exercises  Lumbar      Lumbar Exercises: Aerobic   Nustep  Nustep x 30 mins  L6      Lumbar Exercises: Standing   Row  Strengthening xts pink 4x10    Shoulder Extension  Strengthening;20 reps XTS pink  2x20    Other Standing Lumbar Exercises  Lat pulldown XTS pink 4x10, Punches x 40/; woodchops 2x10 each               PT Short Term Goals - 03/29/17 2059      PT SHORT TERM GOAL #1   Title  Patient will be independent with HEP    Time  2    Period  Weeks    Status  New    Target Date  04/12/17        PT Long Term Goals - 03/29/17  2101      PT LONG TERM GOAL #1   Title  Patient will decrease pain to less than or equal to 2/10 with ADLs and functional activities.    Time  6    Period  Weeks    Status  New    Target Date  05/10/17      PT LONG TERM GOAL #2   Title  Patient will improve bilateral hip flexion strength to 5/5 to improve stability.    Time  6    Period  Weeks    Status  New    Target Date  05/10/17      PT LONG TERM GOAL #3   Title  Patient will improve bilateral hamstring flexibility to greater than 50 degrees.    Time  6    Period  Weeks  Status  New    Target Date  05/10/17      PT LONG TERM GOAL #4   Title  Patient will sit comfortably for greater than 30 minutes with less than 1/10 pain in order to drive.    Time  6    Period  Weeks    Status  New      PT LONG TERM GOAL #5   Title  Patient will self report sleeping throughout the night with less than 2/10 pain.    Time  6    Period  Weeks    Status  New    Target Date  05/10/17            Plan - 05/02/17 1021    Clinical Impression Statement  Pt arrived today after yesterdays visit still feeling fairly well with pain 2/10. He was able to perform core and conditioning exs today with mainly fatigue.    Clinical Presentation  Stable    Clinical Decision Making  Low    Rehab Potential  Good    PT Frequency  2x / week    PT Duration  6 weeks    PT Treatment/Interventions  ADLs/Self Care Home Management;Electrical Stimulation;Cryotherapy;Iontophoresis 4mg /ml Dexamethasone;Moist Heat;Ultrasound;Gait training;Stair training;Therapeutic exercise;Patient/family education;Neuromuscular re-education;Balance training;Dry needling;Passive range of motion;Manual techniques;Therapeutic activities    PT Next Visit Plan  try elliptical    Consulted and Agree with Plan of Care  Patient       Patient will benefit from skilled therapeutic intervention in order to improve the following deficits and impairments:  Pain, Decreased range of  motion, Decreased strength, Decreased balance, Impaired flexibility  Visit Diagnosis: Unsteadiness on feet  Chronic midline low back pain, with sciatica presence unspecified     Problem List There are no active problems to display for this patient.   Dior Stepter,CHRIS, PTA 05/02/2017, 10:38 AM  [facility] [address] [place], [place], [address] Phone: [phone]   Fax:  [phone]  Name: Patrick Guerrero MRN: [id number] Date of Birth: 1961/01/05

## 2017-05-08 ENCOUNTER — Encounter: Payer: Self-pay | Admitting: *Deleted

## 2017-05-08 ENCOUNTER — Ambulatory Visit: Payer: Non-veteran care | Admitting: *Deleted

## 2017-05-08 DIAGNOSIS — R2681 Unsteadiness on feet: Secondary | ICD-10-CM

## 2017-05-08 DIAGNOSIS — G8929 Other chronic pain: Secondary | ICD-10-CM

## 2017-05-08 DIAGNOSIS — M545 Low back pain: Secondary | ICD-10-CM | POA: Diagnosis not present

## 2017-05-08 NOTE — Therapy (Signed)
[facility] [address] [place], [place], [address] Phone: [phone]   Fax:  [phone]  Physical Therapy Treatment  Patient Details  Name: Patrick DraftsKurt Guerrero MRN: [id number] Date of Birth: 27-Feb-1960 Referring Provider: Andee LinemanPiva, Enrico   Encounter Date: 05/08/2017  PT End of Session - 05/08/17 0850    Visit Number  10    Number of Visits  12    Date for PT Re-Evaluation  05/10/17    PT Start Time  0815    PT Stop Time  0903    PT Time Calculation (min)  48 min       History reviewed. No pertinent past medical history.  Past Surgical History:  Procedure Laterality Date  . FRACTURE SURGERY      There were no vitals filed for this visit.  Subjective Assessment - 05/08/17 0827    Subjective  Still doing fair this morning but didn't sleep well due to both feet burning 6/10    Pertinent History  Guillian-Barre Syndrome 2015    Limitations  Sitting    Diagnostic tests  MRI L1-L5 lumbar disc degeneration    Patient Stated Goals  get rid of pain, move better, get back to working out.    Currently in Pain?  Yes    Pain Score  6     Pain Location  Foot    Pain Orientation  Right;Left    Pain Descriptors / Indicators  Burning    Pain Type  Chronic pain    Pain Onset  More than a month ago                      [facility]Ex[facility] - 05/08/17 0001      Exercises   Exercises  Lumbar      Lumbar Exercises: Aerobic   Nustep  Nustep x 15 mins  L6      Lumbar Exercises: Standing   Row  Strengthening xts pink 4x10    Shoulder Extension  Strengthening;20 reps XTS pink  2x20    Other Standing Lumbar Exercises  Lat pulldown XTS pink 4x10, Punches x 40/; woodchops 2x10 each      Lumbar Exercises: Prone   Opposite Arm/Leg Raise  Right arm/Left leg;Left arm/Right leg;20 reps 2x20      Lumbar Exercises: Quadruped   Straight Leg Raise  20 reps    Opposite Arm/Leg Raise  20 reps;5 seconds                PT Short Term Goals - 03/29/17 2059      PT SHORT TERM GOAL #1   Title  Patient will be independent with HEP    Time  2    Period  Weeks    Status  New    Target Date  04/12/17        PT Long Term Goals - 03/29/17 2101      PT LONG TERM GOAL #1   Title  Patient will decrease pain to less than or equal to 2/10 with ADLs and functional activities.    Time  6    Period  Weeks    Status  New    Target Date  05/10/17      PT LONG TERM GOAL #2   Title  Patient will improve bilateral hip flexion strength to 5/5 to improve stability.    Time  6    Period  Weeks    Status  New  Target Date  05/10/17      PT LONG TERM GOAL #3   Title  Patient will improve bilateral hamstring flexibility to greater than 50 degrees.    Time  6    Period  Weeks    Status  New    Target Date  05/10/17      PT LONG TERM GOAL #4   Title  Patient will sit comfortably for greater than 30 minutes with less than 1/10 pain in order to drive.    Time  6    Period  Weeks    Status  New      PT LONG TERM GOAL #5   Title  Patient will self report sleeping throughout the night with less than 2/10 pain.    Time  6    Period  Weeks    Status  New    Target Date  05/10/17            Plan - 05/08/17 0851    Clinical Impression Statement  Pt arrived today doing fairly well, but continues to have  burning in both feet still. He did well with core strengthening.    Clinical Presentation  Stable    Clinical Decision Making  Low    Rehab Potential  Good    PT Frequency  2x / week    PT Duration  6 weeks    PT Treatment/Interventions  ADLs/Self Care Home Management;Electrical Stimulation;Cryotherapy;Iontophoresis 4mg /ml Dexamethasone;Moist Heat;Ultrasound;Gait training;Stair training;Therapeutic exercise;Patient/family education;Neuromuscular re-education;Balance training;Dry needling;Passive range of motion;Manual techniques;Therapeutic activities    PT Next Visit Plan  DC next  visit    Consulted and Agree with Plan of Care  Patient       Patient will benefit from skilled therapeutic intervention in order to improve the following deficits and impairments:  Pain, Decreased range of motion, Decreased strength, Decreased balance, Impaired flexibility  Visit Diagnosis: Unsteadiness on feet  Chronic midline low back pain, with sciatica presence unspecified     Problem List There are no active problems to display for this patient.   ,CHRIS, PTA 05/08/2017, 9:59 AM  [facility] [address] [place], [place], [address] Phone: [phone]   Fax:  [phone]  Name: Patrick Guerrero MRN: [id number] Date of Birth: [birth year]

## 2017-05-10 ENCOUNTER — Ambulatory Visit: Payer: Non-veteran care | Admitting: *Deleted

## 2017-05-10 DIAGNOSIS — R2681 Unsteadiness on feet: Secondary | ICD-10-CM

## 2017-05-10 DIAGNOSIS — M545 Low back pain: Secondary | ICD-10-CM

## 2017-05-10 DIAGNOSIS — G8929 Other chronic pain: Secondary | ICD-10-CM

## 2017-05-10 NOTE — Therapy (Addendum)
[facility] [address] [place], [place], [address] Phone: [phone]   Fax:  [phone]  Physical Therapy Treatment  Patient Details  Name: Patrick Guerrero MRN: [id number] Date of Birth: 08/09/1960 Referring Provider: Piva, Enrico   Encounter Date: 05/10/2017  PT End of Session - 05/10/17 1702    Visit Number  11    Number of Visits  12    Date for PT Re-Evaluation  05/10/17    PT Start Time  1650    PT Stop Time  1725    PT Time Calculation (min)  35 min    Activity Tolerance  Patient tolerated treatment well    Behavior During Therapy  WFL for tasks assessed/performed       No past medical history on file.  Past Surgical History:  Procedure Laterality Date  . FRACTURE SURGERY      There were no vitals filed for this visit.  Subjective Assessment - 05/10/17 1701    Subjective  Bad day. Only slept 2 hrs    Pertinent History  Guillian-Barre Syndrome 2015    Limitations  Sitting    Diagnostic tests  MRI L1-L5 lumbar disc degeneration    Patient Stated Goals  get rid of pain, move better, get back to working out.    Currently in Pain?  Yes    Pain Score  7     Pain Location  Foot both feet burning    Pain Orientation  Left;Right    Pain Descriptors / Indicators  Burning    Pain Type  Chronic pain    Pain Onset  More than a month ago                      [facility] Adult PT Treatment/Exercise - 05/10/17 0001      Exercises   Exercises  Lumbar      Lumbar Exercises: Aerobic   Nustep  Nustep x 30 mins  L6      Lumbar Exercises: Standing   Row  --    Shoulder Extension  --    Other Standing Lumbar Exercises  --      Lumbar Exercises: Prone   Opposite Arm/Leg Raise  -- 2x20      Lumbar Exercises: Quadruped   Opposite Arm/Leg Raise  --               PT Short Term Goals - 03/29/17 2059      PT SHORT TERM GOAL #1   Title  Patient will be independent with HEP    Time  2    Period  Weeks     Status  New    Target Date  04/12/17        PT Long Term Goals - 05/10/17 1702      PT LONG TERM GOAL #1   Title  Patient will decrease pain to less than or equal to 2/10 with ADLs and functional activities.    Time  6    Period  Weeks    Status  Not Met NM pain 5-7/10      PT LONG TERM GOAL #2   Title  Patient will improve bilateral hip flexion strength to 5/5 to improve stability.    Time  6    Period  Weeks    Status  Not Met NM 4/5      PT LONG TERM GOAL #3   Title  Patient will improve bilateral hamstring flexibility   to greater than 50 degrees.    Time  6    Period  Weeks    Status  Not Met NM due to ROM deficit      PT LONG TERM GOAL #4   Title  Patient will sit comfortably for greater than 30 minutes with less than 1/10 pain in order to drive.    Time  6    Period  Weeks    Status  Not Met pain 6-8/10      PT LONG TERM GOAL #5   Title  Patient will self report sleeping throughout the night with less than 2/10 pain.    Time  6    Period  Weeks    Status  Not Met pain 5-7/10            Plan - 05/10/17 1800    Clinical Impression Statement  Pt arrived today not doing well due to LBP and  burning in both feet. He opted not to perform all the exs due to pain. He was unable to meet LTGs due tohigh pain levels and weakness..  Pt reports feeling much stronger in his core and has improved posture. Pt to return to MD for further testing.     Clinical Presentation  Stable    Clinical Decision Making  Low    Rehab Potential  Good    PT Frequency  2x / week    PT Duration  6 weeks    PT Treatment/Interventions  ADLs/Self Care Home Management;Electrical Stimulation;Cryotherapy;Iontophoresis 4mg/ml Dexamethasone;Moist Heat;Ultrasound;Gait training;Stair training;Therapeutic exercise;Patient/family education;Neuromuscular re-education;Balance training;Dry needling;Passive range of motion;Manual techniques;Therapeutic activities    PT Next Visit Plan  DC to HEP        Patient will benefit from skilled therapeutic intervention in order to improve the following deficits and impairments:  Pain, Decreased range of motion, Decreased strength, Decreased balance, Impaired flexibility  Visit Diagnosis: Unsteadiness on feet  Chronic midline low back pain, with sciatica presence unspecified     Problem List There are no active problems to display for this patient.   PHYSICAL THERAPY DISCHARGE SUMMARY  Visits from Start of Care: 11  Current functional level related to goals / functional outcomes: See above   Remaining deficits: See goals   Education / Equipment: HEP Plan: Patient agrees to discharge.  Patient goals were not met. Patient is being discharged due to lack of progress.  ?????        RAMSEUR,CHRIS, PTA 05/10/2017, 6:09 PM  [facility] [address] [place], [place], [address] Phone: [phone]   Fax:  [phone]  Name: Patrick Guerrero MRN: [id number] Date of Birth: 04/22/1960   

## 2017-05-11 ENCOUNTER — Encounter: Payer: 59 | Admitting: *Deleted

## 2023-02-02 ENCOUNTER — Encounter (HOSPITAL_COMMUNITY): Payer: Self-pay | Admitting: *Deleted

## 2023-02-02 ENCOUNTER — Other Ambulatory Visit: Payer: Self-pay

## 2023-02-02 ENCOUNTER — Other Ambulatory Visit (HOSPITAL_COMMUNITY): Payer: Non-veteran care

## 2023-02-02 ENCOUNTER — Emergency Department (HOSPITAL_COMMUNITY)
Admission: EM | Admit: 2023-02-02 | Discharge: 2023-02-02 | Disposition: A | Discharge status: Home / Self Care | Payer: No Typology Code available for payment source | Attending: Emergency Medicine | Admitting: Emergency Medicine

## 2023-02-02 ENCOUNTER — Emergency Department (HOSPITAL_COMMUNITY): Payer: No Typology Code available for payment source

## 2023-02-02 DIAGNOSIS — R519 Headache, unspecified: Secondary | ICD-10-CM | POA: Diagnosis not present

## 2023-02-02 DIAGNOSIS — M5431 Sciatica, right side: Secondary | ICD-10-CM

## 2023-02-02 DIAGNOSIS — M5441 Lumbago with sciatica, right side: Secondary | ICD-10-CM | POA: Insufficient documentation

## 2023-02-02 DIAGNOSIS — G8929 Other chronic pain: Secondary | ICD-10-CM

## 2023-02-02 DIAGNOSIS — M546 Pain in thoracic spine: Secondary | ICD-10-CM | POA: Diagnosis not present

## 2023-02-02 DIAGNOSIS — M545 Low back pain, unspecified: Secondary | ICD-10-CM | POA: Diagnosis present

## 2023-02-02 HISTORY — DX: Essential (primary) hypertension: I10

## 2023-02-02 HISTORY — DX: Guillain-Barre syndrome: G61.0

## 2023-02-02 LAB — CBC WITH DIFFERENTIAL/PLATELET
Abs Immature Granulocytes: 0.02 10*3/uL (ref 0.00–0.07)
Basophils Absolute: 0 10*3/uL (ref 0.0–0.1)
Basophils Relative: 0 %
Eosinophils Absolute: 0.1 10*3/uL (ref 0.0–0.5)
Eosinophils Relative: 1 %
HCT: 42.4 % (ref 39.0–52.0)
Hemoglobin: 14.5 g/dL (ref 13.0–17.0)
Immature Granulocytes: 0 %
Lymphocytes Relative: 23 %
Lymphs Abs: 1.9 10*3/uL (ref 0.7–4.0)
MCH: 31.6 pg (ref 26.0–34.0)
MCHC: 34.2 g/dL (ref 30.0–36.0)
MCV: 92.4 fL (ref 80.0–100.0)
Monocytes Absolute: 0.9 10*3/uL (ref 0.1–1.0)
Monocytes Relative: 11 %
Neutro Abs: 5.5 10*3/uL (ref 1.7–7.7)
Neutrophils Relative %: 65 %
Platelets: 227 10*3/uL (ref 150–400)
RBC: 4.59 MIL/uL (ref 4.22–5.81)
RDW: 12.4 % (ref 11.5–15.5)
WBC: 8.5 10*3/uL (ref 4.0–10.5)
nRBC: 0 % (ref 0.0–0.2)

## 2023-02-02 LAB — COMPREHENSIVE METABOLIC PANEL
ALT: 20 U/L (ref 0–44)
AST: 20 U/L (ref 15–41)
Albumin: 3.7 g/dL (ref 3.5–5.0)
Alkaline Phosphatase: 80 U/L (ref 38–126)
Anion gap: 12 (ref 5–15)
BUN: 13 mg/dL (ref 8–23)
CO2: 25 mmol/L (ref 22–32)
Calcium: 9.1 mg/dL (ref 8.9–10.3)
Chloride: 98 mmol/L (ref 98–111)
Creatinine, Ser: 0.89 mg/dL (ref 0.61–1.24)
GFR, Estimated: >60 mL/min (ref >60)
Glucose, Bld: 93 mg/dL (ref 70–99)
Potassium: 3.3 mmol/L — ABNORMAL LOW (ref 3.5–5.1)
Sodium: 135 mmol/L (ref 135–145)
Total Bilirubin: 0.5 mg/dL (ref <1.2)
Total Protein: 7.4 g/dL (ref 6.5–8.1)

## 2023-02-02 MED ORDER — HYDROMORPHONE HCL 1 MG/ML IJ SOLN
1.0000 mg | Freq: Once | INTRAMUSCULAR | Status: AC
  Administered 2023-02-02: 1 mg via INTRAVENOUS
  Filled 2023-02-02: qty 1

## 2023-02-02 MED ORDER — OXYCODONE HCL 5 MG PO TABS: 5.0000 mg | ORAL_TABLET | Freq: Four times a day (QID) | ORAL | 0 refills | Status: AC | PRN

## 2023-02-02 MED ORDER — PREDNISONE 50 MG PO TABS
60.0000 mg | ORAL_TABLET | Freq: Once | ORAL | Status: AC
  Administered 2023-02-02: 60 mg via ORAL
  Filled 2023-02-02: qty 1

## 2023-02-02 MED ORDER — PREDNISONE 10 MG PO TABS: 40.0000 mg | ORAL_TABLET | Freq: Every day | ORAL | 0 refills | Status: AC

## 2023-02-02 MED ORDER — PREDNISONE 10 MG PO TABS: 40.0000 mg | ORAL_TABLET | Freq: Every day | ORAL | 0 refills | Status: DC

## 2023-02-02 MED ORDER — GADOBUTROL 1 MMOL/ML IV SOLN
8.0000 mL | Freq: Once | INTRAVENOUS | Status: AC | PRN
  Administered 2023-02-02: 8 mL via INTRAVENOUS

## 2023-02-02 NOTE — ED Triage Notes (Signed)
Pt BIB [facility] for c/o severe back pain that started yesterday  Today pt has had loss bladder this am  Pt having severe headache that he has never had before  Pt was given toradol 15mg  IV by ems  BP 150/85 P 71 O2 99%

## 2023-02-02 NOTE — Discharge Instructions (Addendum)
Take the prednisone as directed.  Take the oxycodone as needed for pain.  Follow-up with your doctors.  MRI without any findings suggestive of emergency.  But plenty of reasons to have back pain.  Return for any new or worse symptoms

## 2023-02-02 NOTE — ED Provider Notes (Signed)
MRI with significant thoracic and lumbar back abnormalities.  Nothing that is acute emergency.  Will treat patient with prednisone oxycodone prescription would not send electronically so printed for him.  Pharmacy can call at the [facility] if necessary.  [facility]  Return for any new or worse symptoms.   Vanetta Mulders, MD 02/02/23 [phone]

## 2023-02-02 NOTE — ED Provider Notes (Signed)
[facility] Provider Note   CSN: [id number] Arrival date & time: 02/02/23  1043     History  Chief Complaint  Patient presents with   Back Pain    Patrick Guerrero is a 62 y.o. male.  HPI 62 year old male with a history of chronic back pain presents with severe back pain.  He states yesterday morning around 2:30 AM he woke up with severe back pain.  He has chronic back pain issues and received some sort of spinal injection a few months ago that helped for a few months.  His typical pain is in his low back and radiates down his right leg.  That is going on today but also some midthoracic back pain.  He states 2 nights ago he carried Christmas decorations up and down the stairs a lot but did not remember an injury but wonders if he overdid it.  He denies any saddle anesthesia or leg numbness or weakness.  However he is having pain running down the front of his right leg.  He is also having a headache that is coming and going that is more mild to moderate.  However he is having a little bit of photophobia.  He gets headaches a couple times a month and this headache is not as severe as those typically are.  He denies any fevers but he was sweating last night.  Right now the pain is severe.  At home, patient tried to go to the bathroom and urinated on himself.  However he states he knew he had to go to the bathroom and has some chronic prostate issues.  However the pain hit him and brought him to his knees and he was unable to make it to the bathroom in time.  He has not had any other urinary or bowel incontinence. No history of HIV or illicit drug use.   Home Medications Prior to Admission medications   Medication Sig Start Date End Date Taking? Authorizing Provider  Cyclobenzaprine HCl (FLEXERIL PO) Take by mouth.    [provider]  oxyCODONE (ROXICODONE) 5 MG immediate release tablet Take 1 tablet (5 mg total) by mouth every 4 (four) hours  as needed for severe pain. 08/27/14   Lenell Antu, MD  Pregabalin (LYRICA PO) Take by mouth.    [provider]      Allergies    Patient has no known allergies.    Review of Systems   Review of Systems  Constitutional:  Positive for diaphoresis. Negative for fever.  Gastrointestinal:  Positive for nausea (when the pain gets severe).  Musculoskeletal:  Positive for back pain. Negative for neck pain and neck stiffness.  Neurological:  Positive for numbness and headaches. Negative for weakness.    Physical Exam Updated Vital Signs BP (!) 155/78   Pulse 72   Temp 98 F (36.7 C) (Oral)   Resp 18   Ht 5\' 10"  (1.778 m)   Wt 80.7 kg   SpO2 96%   BMI 25.54 kg/m  Physical Exam Vitals and nursing note reviewed.  Constitutional:      Appearance: He is well-developed. He is not ill-appearing or diaphoretic.     Comments: In severe pain  HENT:     Head: Normocephalic and atraumatic.  Cardiovascular:     Rate and Rhythm: Normal rate.  Pulmonary:     Effort: Pulmonary effort is normal.  Abdominal:     General: There is no distension.  Palpations: Abdomen is soft.     Tenderness: There is no abdominal tenderness.  Musculoskeletal:     Cervical back: Normal range of motion. No rigidity.     Thoracic back: Tenderness present.     Lumbar back: Tenderness present.  Skin:    General: Skin is warm and dry.  Neurological:     Mental Status: He is alert.     Comments: 5/5 strength in all 4 extremities (though painful with legs). No meningismus. Grossly normal sensation in legs. Normal plantar/dorsiflexion bilaterally     ED Results / Procedures / Treatments   Labs (all labs ordered are listed, but only abnormal results are displayed) Labs Reviewed  COMPREHENSIVE METABOLIC PANEL - Abnormal; Notable for the following components:      Result Value   Potassium 3.3 (*)    All other components within normal limits  CBC WITH DIFFERENTIAL/PLATELET     EKG None  Radiology No results found.  Procedures Procedures    Medications Ordered in ED Medications  HYDROmorphone (DILAUDID) injection 1 mg (1 mg Intravenous Given 02/02/23 1158)  HYDROmorphone (DILAUDID) injection 1 mg (1 mg Intravenous Given 02/02/23 1253)  gadobutrol (GADAVIST) 1 MMOL/ML injection 8 mL (8 mLs Intravenous Contrast Given 02/02/23 1450)    ED Course/ Medical Decision Making/ A&P                                 Medical Decision Making Amount and/or Complexity of Data Reviewed Labs: ordered. Radiology: ordered.  Risk Prescription drug management.   Patient has intractable pain.  Given some IV Dilaudid.  Sounds like the headache is a recurrent problem, no red flags.  Will get MRI with and without contrast given the sweating and new/intractable pain.  Care transferred to Dr. Deretha Emory.        Final Clinical Impression(s) / ED Diagnoses Final diagnoses:  None    Rx / DC Orders ED Discharge Orders     None         Pricilla Loveless, MD 02/02/23 [phone]

## 2023-08-23 ENCOUNTER — Ambulatory Visit: Admitting: Orthopedic Surgery

## 2023-09-05 ENCOUNTER — Ambulatory Visit: Admitting: Orthopedic Surgery

## 2023-09-05 ENCOUNTER — Other Ambulatory Visit (INDEPENDENT_AMBULATORY_CARE_PROVIDER_SITE_OTHER): Payer: Self-pay

## 2023-09-05 VITALS — BP 145/82 | HR 92 | Ht 70.0 in | Wt 180.0 lb

## 2023-09-05 DIAGNOSIS — M545 Low back pain, unspecified: Secondary | ICD-10-CM

## 2023-09-05 MED ORDER — METHOCARBAMOL 500 MG PO TABS: 500.0000 mg | ORAL_TABLET | Freq: Four times a day (QID) | ORAL | 0 refills | Status: AC | PRN

## 2023-09-06 NOTE — Progress Notes (Signed)
 Orthopedic Spine Surgery Office Note  Assessment: Patient is a 64 y.o. male with low back pain that radiates into the right lateral thigh and leg consistent with radicular radiculopathy.  Has been improving with conservative treatments   Plan: -Explained that initially conservative treatment is tried as a significant number of patients may experience relief with these treatment modalities. Discussed that the conservative treatments include:  -activity modification  -physical therapy  -over the counter pain medications  -medrol dosepak  -lumbar steroid injections - Since patient's pain has gotten significantly better, did not recommend any further intervention at this time.  Could try repeat injection or any medications since they have helped -Briefly talked about surgical intervention for his foraminal stenosis but again with his improvement symptoms would not recommend that at this time -Patient should return to office on an as-needed basis   Patient expressed understanding of the plan and all questions were answered to the patient's satisfaction.   ___________________________________________________________________________   History:  Patient is a 63 y.o. male who presents today for lumbar spine.  Patient has had pain starting in his back that radiates down the right leg.  He feels the going along the lateral aspect of the thigh and leg to the level of the foot.  He does not have any pain rating into the left lower extremity.  He has tried several conservative treatments and has noticed pain getting better recently.  His pain is now manageable.  There is no trauma or injury that preceded the onset of his pain.   Weakness: Denies Symptoms of imbalance: Denies Paresthesias and numbness: Yes, has bilateral numbness in his feet from neuropathy.  No recent changes. Bowel or bladder incontinence: Denies Saddle anesthesia: Denies  Treatments tried: Tylenol , Flexeril, PT, oral steroids,  steroid injection  Review of systems: Denies fevers and chills, night sweats, unexplained weight loss, history of cancer.  Has had pain that wakes him at night  Past medical history: Neuropathy HTN  Allergies: NKDA  Past surgical history:  Total hip arthroplasty   Social history: Denies use of nicotine product (smoking, vaping, patches, smokeless) Alcohol use: Yes, approximately 2 drinks per week Denies recreational drug use   Physical Exam:  BMI of 25.8  General: no acute distress, appears stated age Neurologic: alert, answering questions appropriately, following commands Respiratory: unlabored breathing on room air, symmetric chest rise Psychiatric: appropriate affect, normal cadence to speech   MSK (spine):  -Strength exam      Left  Right EHL    5/5  5/5 TA    5/5  5/5 GSC    5/5  5/5 Knee extension  5/5  5/5 Hip flexion   5/5  5/5  -Sensory exam    Sensation intact to light touch in L3-S1 nerve distributions of bilateral lower extremities  -Achilles DTR: 2/4 on the left, 2/4 on the right -Patellar tendon DTR: 2/4 on the left, 2/4 on the right  -Straight leg raise: Negative bilaterally -Clonus: no beats bilaterally  -Left hip exam: No pain through range of motion -Right hip exam: No pain through range of motion  Imaging: XRs of the lumbar spine from 09/05/2023 were independently reviewed and interpreted, showing disc height loss at L4/5.  No other significant degenerative changes seen.  No evidence of instability on flexion/extension views.  No fracture or dislocation seen.  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  No other significant stenosis seen.   Patient name: Patrick Guerrero Patient MRN: [id number] Date of visit: 09/05/2023
